# Patient Record
Sex: Female | Born: 1975 | Race: White | Hispanic: No | Marital: Married | Smoking: Never smoker
Health system: Southern US, Community
[De-identification: ages and names within clinical notes are randomized; demographics above are authoritative.]

## PROBLEM LIST (undated history)

## (undated) DIAGNOSIS — R51 Headache: Secondary | ICD-10-CM

## (undated) DIAGNOSIS — K649 Unspecified hemorrhoids: Secondary | ICD-10-CM

## (undated) HISTORY — PX: HERNIA REPAIR: SHX51

## (undated) HISTORY — PX: OTHER SURGICAL HISTORY: SHX169

## (undated) HISTORY — DX: Unspecified hemorrhoids: K64.9

---

## 2004-03-04 ENCOUNTER — Other Ambulatory Visit: Admission: RE | Admit: 2004-03-04 | Discharge: 2004-03-04 | Payer: Self-pay | Admitting: Family Medicine

## 2005-04-16 ENCOUNTER — Other Ambulatory Visit: Admission: RE | Admit: 2005-04-16 | Discharge: 2005-04-16 | Payer: Self-pay | Admitting: Family Medicine

## 2007-01-11 ENCOUNTER — Inpatient Hospital Stay (HOSPITAL_COMMUNITY): Admission: AD | Admit: 2007-01-11 | Discharge: 2007-01-13 | Payer: Self-pay | Admitting: Obstetrics and Gynecology

## 2009-02-07 ENCOUNTER — Other Ambulatory Visit: Admission: RE | Admit: 2009-02-07 | Discharge: 2009-02-07 | Payer: Self-pay | Admitting: Family Medicine

## 2011-02-18 LAB — GC/CHLAMYDIA PROBE AMP, GENITAL: Chlamydia: NEGATIVE

## 2011-02-18 LAB — ANTIBODY SCREEN: Antibody Screen: NEGATIVE

## 2011-02-18 LAB — HIV ANTIBODY (ROUTINE TESTING W REFLEX): HIV: NONREACTIVE

## 2011-02-18 LAB — ABO/RH: RH Type: POSITIVE

## 2011-05-28 LAB — CBC
HCT: 37.6
Hemoglobin: 10.2 — ABNORMAL LOW
Hemoglobin: 12.8
MCHC: 34.4
MCV: 96.8
RBC: 3.06 — ABNORMAL LOW
RBC: 3.87
WBC: 13.4 — ABNORMAL HIGH
WBC: 15.2 — ABNORMAL HIGH

## 2011-09-12 ENCOUNTER — Encounter (HOSPITAL_COMMUNITY): Payer: Self-pay | Admitting: *Deleted

## 2011-09-12 ENCOUNTER — Inpatient Hospital Stay (HOSPITAL_COMMUNITY)
Admission: AD | Admit: 2011-09-12 | Discharge: 2011-09-15 | DRG: 775 | Disposition: A | Discharge status: Home / Self Care | Payer: 59 | Source: Ambulatory Visit | Attending: Obstetrics and Gynecology | Admitting: Obstetrics and Gynecology

## 2011-09-12 DIAGNOSIS — O09529 Supervision of elderly multigravida, unspecified trimester: Secondary | ICD-10-CM | POA: Diagnosis present

## 2011-09-12 HISTORY — DX: Headache: R51

## 2011-09-12 NOTE — Progress Notes (Signed)
Pt states," My water broke at 9:40 pm . It was a lot of clear fluid which has continued to leak. I've had mild contractions off and on all day. They are now getting more regular and closer together.'

## 2011-09-13 ENCOUNTER — Encounter (HOSPITAL_COMMUNITY): Payer: Self-pay | Admitting: *Deleted

## 2011-09-13 LAB — CBC
Hemoglobin: 12.3 g/dL (ref 12.0–15.0)
MCH: 32.6 pg (ref 26.0–34.0)
MCHC: 34 g/dL (ref 30.0–36.0)
MCHC: 34.1 g/dL (ref 30.0–36.0)
MCV: 95.9 fL (ref 78.0–100.0)
Platelets: 108 10*3/uL — ABNORMAL LOW (ref 150–400)
RBC: 3.78 MIL/uL — ABNORMAL LOW (ref 3.87–5.11)
RDW: 13.1 % (ref 11.5–15.5)
WBC: 13.5 10*3/uL — ABNORMAL HIGH (ref 4.0–10.5)

## 2011-09-13 LAB — COMPREHENSIVE METABOLIC PANEL
AST: 23 U/L (ref 0–37)
Albumin: 3.3 g/dL — ABNORMAL LOW (ref 3.5–5.2)
CO2: 19 mEq/L (ref 19–32)
Calcium: 9 mg/dL (ref 8.4–10.5)
Creatinine, Ser: 0.51 mg/dL (ref 0.50–1.10)
GFR calc non Af Amer: >90 mL/min (ref >90)

## 2011-09-13 MED ORDER — ONDANSETRON HCL 4 MG/2ML IJ SOLN: 4.0000 mg | Freq: Four times a day (QID) | INTRAMUSCULAR | Status: DC | PRN

## 2011-09-13 MED ORDER — PRENATAL MULTIVITAMIN CH
1.0000 | ORAL_TABLET | Freq: Every day | ORAL | Status: DC
  Administered 2011-09-13 – 2011-09-15 (×3): 1 via ORAL
  Filled 2011-09-13 (×3): qty 1

## 2011-09-13 MED ORDER — LACTATED RINGERS IV SOLN: 500.0000 mL | INTRAVENOUS | Status: DC | PRN

## 2011-09-13 MED ORDER — SODIUM CHLORIDE 0.9 % IV SOLN
2.0000 g | Freq: Once | INTRAVENOUS | Status: AC
  Administered 2011-09-13: 2 g via INTRAVENOUS
  Filled 2011-09-13: qty 2000

## 2011-09-13 MED ORDER — PHENYLEPHRINE 40 MCG/ML (10ML) SYRINGE FOR IV PUSH (FOR BLOOD PRESSURE SUPPORT): 80.0000 ug | PREFILLED_SYRINGE | INTRAVENOUS | Status: DC | PRN

## 2011-09-13 MED ORDER — FLEET ENEMA 7-19 GM/118ML RE ENEM: 1.0000 | ENEMA | Freq: Every day | RECTAL | Status: DC | PRN

## 2011-09-13 MED ORDER — ZOLPIDEM TARTRATE 5 MG PO TABS: 5.0000 mg | ORAL_TABLET | Freq: Every evening | ORAL | Status: DC | PRN

## 2011-09-13 MED ORDER — LIDOCAINE HCL (PF) 1 % IJ SOLN
30.0000 mL | INTRAMUSCULAR | Status: DC | PRN
  Filled 2011-09-13: qty 30

## 2011-09-13 MED ORDER — LANOLIN HYDROUS EX OINT: TOPICAL_OINTMENT | CUTANEOUS | Status: DC | PRN

## 2011-09-13 MED ORDER — CITRIC ACID-SODIUM CITRATE 334-500 MG/5ML PO SOLN: 30.0000 mL | ORAL | Status: DC | PRN

## 2011-09-13 MED ORDER — SODIUM CHLORIDE 0.9 % IV SOLN
2.0000 g | Freq: Four times a day (QID) | INTRAVENOUS | Status: DC
  Administered 2011-09-13: 2 g via INTRAVENOUS
  Filled 2011-09-13 (×2): qty 2000

## 2011-09-13 MED ORDER — OXYTOCIN BOLUS FROM INFUSION
500.0000 mL | Freq: Once | INTRAVENOUS | Status: AC
  Administered 2011-09-13: 500 mL via INTRAVENOUS
  Filled 2011-09-13: qty 500
  Filled 2011-09-13: qty 1000

## 2011-09-13 MED ORDER — ONDANSETRON HCL 4 MG/2ML IJ SOLN: 4.0000 mg | INTRAMUSCULAR | Status: DC | PRN

## 2011-09-13 MED ORDER — DIPHENHYDRAMINE HCL 25 MG PO CAPS: 25.0000 mg | ORAL_CAPSULE | Freq: Four times a day (QID) | ORAL | Status: DC | PRN

## 2011-09-13 MED ORDER — DIBUCAINE 1 % RE OINT: 1.0000 "application " | TOPICAL_OINTMENT | RECTAL | Status: DC | PRN

## 2011-09-13 MED ORDER — EPHEDRINE 5 MG/ML INJ: 10.0000 mg | INTRAVENOUS | Status: DC | PRN

## 2011-09-13 MED ORDER — FLEET ENEMA 7-19 GM/118ML RE ENEM: 1.0000 | ENEMA | RECTAL | Status: DC | PRN

## 2011-09-13 MED ORDER — FENTANYL 2.5 MCG/ML BUPIVACAINE 1/10 % EPIDURAL INFUSION (WH - ANES): 14.0000 mL/h | INTRAMUSCULAR | Status: DC

## 2011-09-13 MED ORDER — ACETAMINOPHEN 325 MG PO TABS: 650.0000 mg | ORAL_TABLET | ORAL | Status: DC | PRN

## 2011-09-13 MED ORDER — DIPHENHYDRAMINE HCL 50 MG/ML IJ SOLN: 12.5000 mg | INTRAMUSCULAR | Status: DC | PRN

## 2011-09-13 MED ORDER — BENZOCAINE-MENTHOL 20-0.5 % EX AERO
INHALATION_SPRAY | CUTANEOUS | Status: AC
  Filled 2011-09-13: qty 56

## 2011-09-13 MED ORDER — SENNOSIDES-DOCUSATE SODIUM 8.6-50 MG PO TABS: 2.0000 | ORAL_TABLET | Freq: Every day | ORAL | Status: DC

## 2011-09-13 MED ORDER — BISACODYL 10 MG RE SUPP: 10.0000 mg | Freq: Every day | RECTAL | Status: DC | PRN

## 2011-09-13 MED ORDER — SIMETHICONE 80 MG PO CHEW: 80.0000 mg | CHEWABLE_TABLET | ORAL | Status: DC | PRN

## 2011-09-13 MED ORDER — IBUPROFEN 600 MG PO TABS: 600.0000 mg | ORAL_TABLET | Freq: Four times a day (QID) | ORAL | Status: DC | PRN

## 2011-09-13 MED ORDER — OXYTOCIN 20 UNITS IN LACTATED RINGERS INFUSION - SIMPLE
125.0000 mL/h | Freq: Once | INTRAVENOUS | Status: AC
  Administered 2011-09-13: 125 mL/h via INTRAVENOUS

## 2011-09-13 MED ORDER — OXYCODONE-ACETAMINOPHEN 5-325 MG PO TABS: 1.0000 | ORAL_TABLET | ORAL | Status: DC | PRN

## 2011-09-13 MED ORDER — LACTATED RINGERS IV SOLN
INTRAVENOUS | Status: DC
  Administered 2011-09-12: via INTRAVENOUS

## 2011-09-13 MED ORDER — BUTORPHANOL TARTRATE 2 MG/ML IJ SOLN
1.0000 mg | INTRAMUSCULAR | Status: DC | PRN
  Administered 2011-09-13: 1 mg via INTRAVENOUS
  Filled 2011-09-13: qty 1

## 2011-09-13 MED ORDER — TETANUS-DIPHTH-ACELL PERTUSSIS 5-2.5-18.5 LF-MCG/0.5 IM SUSP: 0.5000 mL | Freq: Once | INTRAMUSCULAR | Status: DC

## 2011-09-13 MED ORDER — BENZOCAINE-MENTHOL 20-0.5 % EX AERO: 1.0000 "application " | INHALATION_SPRAY | CUTANEOUS | Status: DC | PRN

## 2011-09-13 MED ORDER — IBUPROFEN 600 MG PO TABS
600.0000 mg | ORAL_TABLET | Freq: Four times a day (QID) | ORAL | Status: DC
  Administered 2011-09-13 – 2011-09-15 (×7): 600 mg via ORAL
  Filled 2011-09-13 (×10): qty 1

## 2011-09-13 MED ORDER — WITCH HAZEL-GLYCERIN EX PADS: 1.0000 "application " | MEDICATED_PAD | CUTANEOUS | Status: DC | PRN

## 2011-09-13 MED ORDER — ONDANSETRON HCL 4 MG PO TABS: 4.0000 mg | ORAL_TABLET | ORAL | Status: DC | PRN

## 2011-09-13 MED ORDER — EPHEDRINE 5 MG/ML INJ
10.0000 mg | INTRAVENOUS | Status: DC | PRN
Start: 2011-09-13 — End: 2011-09-13

## 2011-09-13 MED ORDER — LACTATED RINGERS IV SOLN: 500.0000 mL | Freq: Once | INTRAVENOUS | Status: DC

## 2011-09-13 NOTE — Progress Notes (Signed)
Delivery Note Excellent control, FHT reactive Two contractions-SVD VFI Apgars 9/9     Weight pending     Arterial ph  7.20 Placenta 3 vessels, intact EBL  350cc Second degree ML Lac repaired Pt/infant stable in LDR

## 2011-09-13 NOTE — H&P (Signed)
Bailey Norton is a 36 y.o. female presenting for SROM about 9:30pm, clear, now with some UCs.  No CNS change/epigastric pain. Maternal Medical History:  Reason for admission: Reason for admission: rupture of membranes.  Contractions: Onset was 3-5 hours ago.   Frequency: irregular.   Perceived severity is moderate.    Fetal activity: Perceived fetal activity is normal.      OB History    Grav Para Term Preterm Abortions TAB SAB Ect Mult Living   3 1 1  1  1   1      Past Medical History  Diagnosis Date  . Headache     hx migraines   Past Surgical History  Procedure Date  . Hernia repair     age 3   Family History: family history is not on file. Social History:  reports that she has never smoked. She does not have any smokeless tobacco history on file. She reports that she does not drink alcohol or use illicit drugs.  Review of Systems  Eyes: Negative for blurred vision.  Gastrointestinal: Negative for abdominal pain.  Neurological: Negative for headaches.    Dilation: 2.5 Effacement (%): 50 Station: -1 Exam by:: A.Davis,RN Blood pressure 141/66, pulse 105, temperature 98.3 F (36.8 C), temperature source Oral, resp. rate 20, height 5' 5.5" (1.664 m), weight 85.843 kg (189 lb 4 oz). Maternal Exam:  Uterine Assessment: Contraction strength is moderate.  Contraction frequency is irregular.   Abdomen: Patient reports no abdominal tenderness.   Fetal Exam Fetal Monitor Review: Pattern: accelerations present.       Physical Exam  Cardiovascular: Normal rate and regular rhythm.   Respiratory: Effort normal and breath sounds normal.  Neurological: She has normal reflexes.    Prenatal labs: ABO, Rh:   Antibody:   Rubella:   RPR:    HBsAg:    HIV:    GBS:     Assessment/Plan: 36 yo G3P1 at 56 6/7 weeks with SROM.  Antibiotics for GBBS prophylaxis.   Danaiya Steadman II,Javontay Vandam E 09/13/2011, 12:35 AM

## 2011-09-13 NOTE — Progress Notes (Signed)
FHT reactive, some variable decels with UC, good response to scalp stim  UCs q3-4 min  Cx 4/C/-1/vtx  D/W pt platelets--  C/W last pregnancy Repeat CBC/CMET/Uric Acid

## 2011-09-14 LAB — CBC
MCV: 97.1 fL (ref 78.0–100.0)
Platelets: 102 10*3/uL — ABNORMAL LOW (ref 150–400)
RBC: 3.15 MIL/uL — ABNORMAL LOW (ref 3.87–5.11)
RDW: 13.2 % (ref 11.5–15.5)
WBC: 14.8 10*3/uL — ABNORMAL HIGH (ref 4.0–10.5)

## 2011-09-14 LAB — RPR: RPR Ser Ql: NONREACTIVE

## 2011-09-14 NOTE — Progress Notes (Signed)
Post Partum Day 1 Subjective: no complaints, up ad lib, voiding, tolerating PO and + flatus  Objective: Blood pressure 92/55, pulse 73, temperature 97.9 F (36.6 C), temperature source Oral, resp. rate 18, height 5' 5.5" (1.664 m), weight 85.843 kg (189 lb 4 oz), unknown if currently breastfeeding.  Physical Exam:  General: alert and cooperative Lochia: appropriate Uterine Fundus: firm Perineum intact DVT Evaluation: No evidence of DVT seen on physical exam.   Basename 09/14/11 0524 09/13/11 0304  HGB 10.4* 12.6  HCT 30.6* 37.0    Assessment/Plan: Plan for discharge tomorrow   LOS: 2 days   Marlyss Cissell G 09/14/2011, 7:50 AM

## 2011-09-15 NOTE — Progress Notes (Signed)
Post Partum Day 2 Subjective: no complaints, up ad lib, voiding and tolerating PO  Objective: Blood pressure 111/80, pulse 105, temperature 97.9 F (36.6 C), temperature source Oral, resp. rate 18, height 5' 5.5" (1.664 m), weight 85.843 kg (189 lb 4 oz), unknown if currently breastfeeding.  Physical Exam:  General: alert and cooperative Lochia: appropriate Uterine Fundus: firm Perineum intact DVT Evaluation: No evidence of DVT seen on physical exam.   Basename 09/14/11 0524 09/13/11 0304  HGB 10.4* 12.6  HCT 30.6* 37.0    Assessment/Plan: Discharge home   LOS: 3 days   CURTIS,CAROL G 09/15/2011, 7:40 AM

## 2011-09-15 NOTE — Discharge Summary (Signed)
Obstetric Discharge Summary Reason for Admission: rupture of membranes Prenatal Procedures: ultrasound Intrapartum Procedures: spontaneous vaginal delivery Postpartum Procedures: none Complications-Operative and Postpartum: 2 degree perineal laceration Hemoglobin  Date Value Range Status  09/14/2011 10.4* 12.0-15.0 (g/dL) Final     DELTA CHECK NOTED     REPEATED TO VERIFY     HCT  Date Value Range Status  09/14/2011 30.6* 36.0-46.0 (%) Final    Discharge Diagnoses: Term Pregnancy-delivered  Discharge Information: Date: 09/15/2011 Activity: pelvic rest Diet: routine Medications: PNV and Ibuprofen Condition: stable Instructions: refer to practice specific booklet Discharge to: home   Newborn Data: Live born female  Birth Weight: 7 lb 3.3 oz (3269 g) APGAR: 9, 9  Home with mother.  CURTIS,CAROL G 09/15/2011, 7:44 AM

## 2014-06-11 ENCOUNTER — Encounter (HOSPITAL_COMMUNITY): Payer: Self-pay | Admitting: *Deleted

## 2016-04-22 DIAGNOSIS — H5213 Myopia, bilateral: Secondary | ICD-10-CM | POA: Diagnosis not present

## 2016-04-22 DIAGNOSIS — H43813 Vitreous degeneration, bilateral: Secondary | ICD-10-CM | POA: Diagnosis not present

## 2016-04-22 DIAGNOSIS — H35722 Serous detachment of retinal pigment epithelium, left eye: Secondary | ICD-10-CM | POA: Diagnosis not present

## 2016-04-22 DIAGNOSIS — H353122 Nonexudative age-related macular degeneration, left eye, intermediate dry stage: Secondary | ICD-10-CM | POA: Diagnosis not present

## 2016-11-30 DIAGNOSIS — Z6826 Body mass index (BMI) 26.0-26.9, adult: Secondary | ICD-10-CM | POA: Diagnosis not present

## 2016-11-30 DIAGNOSIS — Z1231 Encounter for screening mammogram for malignant neoplasm of breast: Secondary | ICD-10-CM | POA: Diagnosis not present

## 2016-11-30 DIAGNOSIS — Z01419 Encounter for gynecological examination (general) (routine) without abnormal findings: Secondary | ICD-10-CM | POA: Diagnosis not present

## 2016-11-30 MED FILL — SUMATRIPTAN SUCC 100 MG TAB: 100 | 30 days supply | Qty: 18 | Fill #0

## 2017-02-12 ENCOUNTER — Telehealth: Payer: Self-pay | Admitting: Family Medicine

## 2017-02-12 NOTE — Telephone Encounter (Signed)
Sent to me for labs to be entered by Selinda Orion by mistake.

## 2017-03-17 DIAGNOSIS — D2272 Melanocytic nevi of left lower limb, including hip: Secondary | ICD-10-CM | POA: Diagnosis not present

## 2017-06-02 DIAGNOSIS — H5213 Myopia, bilateral: Secondary | ICD-10-CM | POA: Diagnosis not present

## 2017-06-07 DIAGNOSIS — H353122 Nonexudative age-related macular degeneration, left eye, intermediate dry stage: Secondary | ICD-10-CM | POA: Diagnosis not present

## 2017-06-07 DIAGNOSIS — H43813 Vitreous degeneration, bilateral: Secondary | ICD-10-CM | POA: Diagnosis not present

## 2017-06-07 DIAGNOSIS — H5213 Myopia, bilateral: Secondary | ICD-10-CM | POA: Diagnosis not present

## 2017-07-14 DIAGNOSIS — H43393 Other vitreous opacities, bilateral: Secondary | ICD-10-CM | POA: Diagnosis not present

## 2017-07-14 DIAGNOSIS — H35713 Central serous chorioretinopathy, bilateral: Secondary | ICD-10-CM | POA: Diagnosis not present

## 2017-09-06 DIAGNOSIS — R5382 Chronic fatigue, unspecified: Secondary | ICD-10-CM | POA: Diagnosis not present

## 2017-09-06 DIAGNOSIS — Z5181 Encounter for therapeutic drug level monitoring: Secondary | ICD-10-CM | POA: Diagnosis not present

## 2017-09-06 DIAGNOSIS — Z0000 Encounter for general adult medical examination without abnormal findings: Secondary | ICD-10-CM | POA: Diagnosis not present

## 2017-09-06 DIAGNOSIS — Z13220 Encounter for screening for lipoid disorders: Secondary | ICD-10-CM | POA: Diagnosis not present

## 2017-09-06 DIAGNOSIS — G43009 Migraine without aura, not intractable, without status migrainosus: Secondary | ICD-10-CM | POA: Diagnosis not present

## 2017-09-22 MED FILL — TOPIRAMATE 25 MG TAB: 25 | 90 days supply | Qty: 180 | Fill #0

## 2018-06-06 DIAGNOSIS — H5213 Myopia, bilateral: Secondary | ICD-10-CM | POA: Diagnosis not present

## 2018-06-06 DIAGNOSIS — H52221 Regular astigmatism, right eye: Secondary | ICD-10-CM | POA: Diagnosis not present

## 2018-06-30 MED FILL — SUMAtriptan SUCCINATE 100 M: 100 | 90 days supply | Qty: 24 | Fill #0

## 2019-04-12 DIAGNOSIS — Z5181 Encounter for therapeutic drug level monitoring: Secondary | ICD-10-CM | POA: Diagnosis not present

## 2019-04-12 DIAGNOSIS — Z13220 Encounter for screening for lipoid disorders: Secondary | ICD-10-CM | POA: Diagnosis not present

## 2019-04-19 DIAGNOSIS — G43009 Migraine without aura, not intractable, without status migrainosus: Secondary | ICD-10-CM | POA: Diagnosis not present

## 2019-04-19 MED FILL — SUMAtriptan SUCCINATE 100 M: 100 | 30 days supply | Qty: 9 | Fill #0

## 2019-04-19 MED FILL — VALACYCLOVIR HCL 500 MG TAB: 500 | 30 days supply | Qty: 60 | Fill #0

## 2019-06-01 MED FILL — SUMAtriptan SUCCINATE 100 M: 100 | 30 days supply | Qty: 9 | Fill #0

## 2019-06-01 MED FILL — VALACYCLOVIR HCL 500 MG TAB: 500 | 30 days supply | Qty: 60 | Fill #0

## 2019-08-01 ENCOUNTER — Ambulatory Visit: Payer: 59 | Attending: Internal Medicine

## 2019-08-01 DIAGNOSIS — Z20828 Contact with and (suspected) exposure to other viral communicable diseases: Secondary | ICD-10-CM | POA: Diagnosis not present

## 2019-08-01 DIAGNOSIS — Z20822 Contact with and (suspected) exposure to covid-19: Secondary | ICD-10-CM

## 2019-08-03 LAB — NOVEL CORONAVIRUS, NAA: SARS-CoV-2, NAA: NOT DETECTED

## 2019-08-24 MED FILL — SUMAtriptan SUCCINATE 100 M: 100 | 30 days supply | Qty: 9 | Fill #1

## 2019-09-04 ENCOUNTER — Other Ambulatory Visit: Payer: Self-pay | Admitting: Family Medicine

## 2019-09-04 DIAGNOSIS — Z1231 Encounter for screening mammogram for malignant neoplasm of breast: Secondary | ICD-10-CM

## 2019-09-07 DIAGNOSIS — H5213 Myopia, bilateral: Secondary | ICD-10-CM | POA: Diagnosis not present

## 2019-09-07 DIAGNOSIS — H52221 Regular astigmatism, right eye: Secondary | ICD-10-CM | POA: Diagnosis not present

## 2019-11-29 ENCOUNTER — Other Ambulatory Visit: Payer: Self-pay

## 2019-11-29 ENCOUNTER — Ambulatory Visit
Admission: RE | Admit: 2019-11-29 | Discharge: 2019-11-29 | Disposition: A | Discharge status: Home / Self Care | Payer: 59 | Source: Ambulatory Visit | Attending: Family Medicine | Admitting: Family Medicine

## 2019-11-29 DIAGNOSIS — Z1231 Encounter for screening mammogram for malignant neoplasm of breast: Secondary | ICD-10-CM | POA: Diagnosis not present

## 2019-11-30 ENCOUNTER — Other Ambulatory Visit: Payer: Self-pay | Admitting: Family Medicine

## 2019-11-30 DIAGNOSIS — R928 Other abnormal and inconclusive findings on diagnostic imaging of breast: Secondary | ICD-10-CM

## 2019-12-13 ENCOUNTER — Other Ambulatory Visit: Payer: 59

## 2019-12-14 ENCOUNTER — Ambulatory Visit
Admission: RE | Admit: 2019-12-14 | Discharge: 2019-12-14 | Disposition: A | Discharge status: Home / Self Care | Payer: 59 | Source: Ambulatory Visit | Attending: Family Medicine | Admitting: Family Medicine

## 2019-12-14 ENCOUNTER — Other Ambulatory Visit: Payer: Self-pay

## 2019-12-14 ENCOUNTER — Ambulatory Visit: Payer: 59

## 2019-12-14 DIAGNOSIS — R928 Other abnormal and inconclusive findings on diagnostic imaging of breast: Secondary | ICD-10-CM

## 2019-12-14 DIAGNOSIS — R922 Inconclusive mammogram: Secondary | ICD-10-CM | POA: Diagnosis not present

## 2020-07-03 ENCOUNTER — Other Ambulatory Visit (HOSPITAL_COMMUNITY): Payer: Self-pay | Admitting: Family Medicine

## 2020-09-25 DIAGNOSIS — R030 Elevated blood-pressure reading, without diagnosis of hypertension: Secondary | ICD-10-CM | POA: Diagnosis not present

## 2020-09-25 DIAGNOSIS — G43009 Migraine without aura, not intractable, without status migrainosus: Secondary | ICD-10-CM | POA: Diagnosis not present

## 2020-09-25 DIAGNOSIS — Z5181 Encounter for therapeutic drug level monitoring: Secondary | ICD-10-CM | POA: Diagnosis not present

## 2020-09-25 DIAGNOSIS — R5382 Chronic fatigue, unspecified: Secondary | ICD-10-CM | POA: Diagnosis not present

## 2020-09-25 DIAGNOSIS — Z13220 Encounter for screening for lipoid disorders: Secondary | ICD-10-CM | POA: Diagnosis not present

## 2020-09-25 DIAGNOSIS — Z0000 Encounter for general adult medical examination without abnormal findings: Secondary | ICD-10-CM | POA: Diagnosis not present

## 2020-09-25 MED FILL — SUMAtriptan SUCCINATE 100 M: 100 | 30 days supply | Qty: 9 | Fill #0

## 2020-11-04 DIAGNOSIS — H5213 Myopia, bilateral: Secondary | ICD-10-CM | POA: Diagnosis not present

## 2020-12-18 ENCOUNTER — Other Ambulatory Visit: Payer: Self-pay | Admitting: Family Medicine

## 2020-12-18 ENCOUNTER — Encounter: Payer: Self-pay | Admitting: Gastroenterology

## 2020-12-18 DIAGNOSIS — Z1231 Encounter for screening mammogram for malignant neoplasm of breast: Secondary | ICD-10-CM

## 2021-01-22 ENCOUNTER — Other Ambulatory Visit (HOSPITAL_COMMUNITY): Payer: Self-pay

## 2021-01-22 MED FILL — Sumatriptan Succinate Tab 100 MG: ORAL | 30 days supply | Qty: 9 | Fill #0 | Status: AC

## 2021-02-12 ENCOUNTER — Ambulatory Visit: Payer: 59

## 2021-02-20 ENCOUNTER — Other Ambulatory Visit: Payer: Self-pay

## 2021-02-20 ENCOUNTER — Other Ambulatory Visit (HOSPITAL_BASED_OUTPATIENT_CLINIC_OR_DEPARTMENT_OTHER): Payer: Self-pay

## 2021-02-20 ENCOUNTER — Ambulatory Visit (AMBULATORY_SURGERY_CENTER): Payer: Self-pay | Admitting: *Deleted

## 2021-02-20 VITALS — Ht 65.5 in | Wt 160.8 lb

## 2021-02-20 DIAGNOSIS — Z1211 Encounter for screening for malignant neoplasm of colon: Secondary | ICD-10-CM

## 2021-02-20 MED ORDER — SUPREP BOWEL PREP KIT 17.5-3.13-1.6 GM/177ML PO SOLN
1.0000 | Freq: Once | ORAL | 0 refills | Status: DC
  Filled 2021-02-20: qty 354, 2d supply, fill #0
  Filled 2021-03-19: qty 354, 1d supply, fill #0

## 2021-02-20 NOTE — Progress Notes (Signed)
  No trouble with anesthesia, (received as a child), denies trouble moving neck, or hx/fam hx of malignant hyperthermia per pt   No egg or soy allergy  No home oxygen use   No medications for weight loss taken  emmi information given  Pt denies constipation issues  Pt informed that we do not do prior authorizations for prep

## 2021-03-03 ENCOUNTER — Encounter: Payer: 59 | Admitting: Gastroenterology

## 2021-03-06 ENCOUNTER — Other Ambulatory Visit (HOSPITAL_BASED_OUTPATIENT_CLINIC_OR_DEPARTMENT_OTHER): Payer: Self-pay

## 2021-03-18 ENCOUNTER — Encounter: Payer: Self-pay | Admitting: Gastroenterology

## 2021-03-19 ENCOUNTER — Other Ambulatory Visit (HOSPITAL_COMMUNITY): Payer: Self-pay

## 2021-03-20 ENCOUNTER — Ambulatory Visit (AMBULATORY_SURGERY_CENTER): Payer: 59 | Admitting: Gastroenterology

## 2021-03-20 ENCOUNTER — Other Ambulatory Visit: Payer: Self-pay

## 2021-03-20 ENCOUNTER — Encounter: Payer: Self-pay | Admitting: Gastroenterology

## 2021-03-20 VITALS — BP 98/71 | HR 65 | Temp 97.8°F | Resp 11 | Ht 65.0 in | Wt 160.0 lb

## 2021-03-20 DIAGNOSIS — K635 Polyp of colon: Secondary | ICD-10-CM | POA: Diagnosis not present

## 2021-03-20 DIAGNOSIS — Z1211 Encounter for screening for malignant neoplasm of colon: Secondary | ICD-10-CM | POA: Diagnosis not present

## 2021-03-20 DIAGNOSIS — D124 Benign neoplasm of descending colon: Secondary | ICD-10-CM

## 2021-03-20 MED ORDER — SODIUM CHLORIDE 0.9 % IV SOLN: 500.0000 mL | INTRAVENOUS | Status: DC

## 2021-03-20 NOTE — Progress Notes (Signed)
Called to room to assist during endoscopic procedure.  Patient ID and intended procedure confirmed with present staff. Received instructions for my participation in the procedure from the performing physician.  

## 2021-03-20 NOTE — Patient Instructions (Signed)
Thank you for letting us take care of your healthcare needs today. Please see handouts given to you on Polyps and Hemorrhoids.     YOU HAD AN ENDOSCOPIC PROCEDURE TODAY AT THE [facility]:   Refer to the procedure report that was given to you for any specific questions about what was found during the examination.  If the procedure report does not answer your questions, please call your gastroenterologist to clarify.  If you requested that your care partner not be given the details of your procedure findings, then the procedure report has been included in a sealed envelope for you to review at your convenience later.  YOU SHOULD EXPECT: Some feelings of bloating in the abdomen. Passage of more gas than usual.  Walking can help get rid of the air that was put into your GI tract during the procedure and reduce the bloating. If you had a lower endoscopy (such as a colonoscopy or flexible sigmoidoscopy) you may notice spotting of blood in your stool or on the toilet paper. If you underwent a bowel prep for your procedure, you may not have a normal bowel movement for a few days.  Please Note:  You might notice some irritation and congestion in your nose or some drainage.  This is from the oxygen used during your procedure.  There is no need for concern and it should clear up in a day or so.  SYMPTOMS TO REPORT IMMEDIATELY:  Following lower endoscopy (colonoscopy or flexible sigmoidoscopy):  Excessive amounts of blood in the stool  Significant tenderness or worsening of abdominal pains  Swelling of the abdomen that is new, acute  Fever of 100F or higher  For urgent or emergent issues, a gastroenterologist can be reached at any hour by calling [phone]. Do not use MyChart messaging for urgent concerns.    DIET:  We do recommend a small meal at first, but then you may proceed to your regular diet.  Drink plenty of fluids but you should avoid alcoholic beverages for 24  hours.  ACTIVITY:  You should plan to take it easy for the rest of today and you should NOT DRIVE or use heavy machinery until tomorrow (because of the sedation medicines used during the test).    FOLLOW UP: Our staff will call the number listed on your records 48-72 hours following your procedure to check on you and address any questions or concerns that you may have regarding the information given to you following your procedure. If we do not reach you, we will leave a message.  We will attempt to reach you two times.  During this call, we will ask if you have developed any symptoms of COVID 19. If you develop any symptoms (ie: fever, flu-like symptoms, shortness of breath, cough etc.) before then, please call [phone].  If you test positive for Covid 19 in the 2 weeks post procedure, please call and report this information to us.    If any biopsies were taken you will be contacted by phone or by letter within the next 1-3 weeks.  Please call us at [phone] if you have not heard about the biopsies in 3 weeks.    SIGNATURES/CONFIDENTIALITY: You and/or your care partner have signed paperwork which will be entered into your electronic medical record.  These signatures attest to the fact that that the information above on your After Visit Summary has been reviewed and is understood.  Full responsibility of the confidentiality of this discharge information   lies with you and/or your care-partner.  

## 2021-03-20 NOTE — Progress Notes (Signed)
pt tolerated well. VSS. awake and to recovery. Report given to RN.  

## 2021-03-20 NOTE — Progress Notes (Signed)
   Referring Provider: Maurice Small, MD Primary Care Physician:  Maurice Small, MD  Reason for Consultation:  Screening colonoscopy   IMPRESSION:  Need for colon cancer screening  PLAN: Colonoscopy today  Please see the "Patient Instructions" section for addition details about the plan.  HPI: Bailey Norton is a 45 y.o. female presents for screening colonoscopy. No baseline GI symptoms except for intermittent blood on the toilet paper attributed to hemorrhoids. Has occurred intermittently since pregnancy.  No known family history of colon cancer or polyps. No family history of uterine/endometrial cancer, pancreatic cancer or gastric/stomach cancer.   Past Medical History:  Diagnosis Date   Headache(784.0)    hx migraines   Hemorrhoids     Past Surgical History:  Procedure Laterality Date   HERNIA REPAIR     age 24   left humerus surgery      Current Outpatient Medications  Medication Sig Dispense Refill   Prenatal Vit-Fe Fumarate-FA (PRENATAL MULTIVITAMIN) TABS Take 1 tablet by mouth daily.     SUMAtriptan (IMITREX) 100 MG tablet TAKE 1 TABLET BY MOUTH ONCE A DAY IF NEEDED 9 tablet 3   valACYclovir (VALTREX) 1000 MG tablet Take 1,000 mg by mouth 2 (two) times daily. Takes PRN     Current Facility-Administered Medications  Medication Dose Route Frequency Provider Last Rate Last Admin   0.9 %  sodium chloride infusion  500 mL Intravenous Continuous Thornton Park, MD        Allergies as of 03/20/2021   (No Known Allergies)    Family History  Problem Relation Age of Onset   Colon cancer Neg Hx    Esophageal cancer Neg Hx    Rectal cancer Neg Hx    Stomach cancer Neg Hx       Physical Exam: General:   Alert,  well-nourished, pleasant and cooperative in NAD Head:  Normocephalic and atraumatic. Eyes:  Sclera clear, no icterus.   Conjunctiva pink. Ears:  Normal auditory acuity. Nose:  No deformity, discharge,  or lesions. Mouth:  No deformity or lesions.   Neck:   Supple; no masses or thyromegaly. Lungs:  Clear throughout to auscultation.   No wheezes. Heart:  Regular rate and rhythm; no murmurs. Abdomen:  Soft, nontender, nondistended, normal bowel sounds, no rebound or guarding. No hepatosplenomegaly.   Rectal:  Deferred  Msk:  Symmetrical. No boney deformities LAD: No inguinal or umbilical LAD Extremities:  No clubbing or edema. Neurologic:  Alert and  oriented x4;  grossly nonfocal Skin:  Intact without significant lesions or rashes. Psych:  Alert and cooperative. Normal mood and affect.      Kemyah Buser L. Tarri Glenn, MD, MPH 03/20/2021, 2:25 PM

## 2021-03-20 NOTE — Op Note (Signed)
[facility] Patient Name: Bailey Norton Procedure Date: 03/20/2021 2:19 PM MRN: [id number] Endoscopist: Thornton Park MD, MD Age: 45 Referring MD:  Date of Birth: [birth year] Gender: Female Account #: [id number] Procedure:                Colonoscopy Indications:              Screening for colorectal malignant neoplasm, This                            is the patient's first colonoscopy                           No known family history of colon cancer or polyps Medicines:                Monitored Anesthesia Care Procedure:                Pre-Anesthesia Assessment:                           - Prior to the procedure, a History and Physical                            was performed, and patient medications and                            allergies were reviewed. The patient's tolerance of                            previous anesthesia was also reviewed. The risks                            and benefits of the procedure and the sedation                            options and risks were discussed with the patient.                            All questions were answered, and informed consent                            was obtained. Prior Anticoagulants: The patient has                            taken no previous anticoagulant or antiplatelet                            agents. ASA Grade Assessment: II - A patient with                            mild systemic disease. After reviewing the risks                            and benefits, the patient was deemed in  satisfactory condition to undergo the procedure.                           After obtaining informed consent, the colonoscope                            was passed under direct vision. Throughout the                            procedure, the patient's blood pressure, pulse, and                            oxygen saturations were monitored continuously. The                            Olympus CF-HQ190L  [id number]) Colonoscope was                            introduced through the anus and advanced to the 4                            cm into the ileum. A second forward view of the                            right colon was performed. The colonoscopy was                            performed without difficulty. The patient tolerated                            the procedure well. The quality of the bowel                            preparation was good. The terminal ileum, ileocecal                            valve, appendiceal orifice, and rectum were                            photographed. Scope In: 2:35:43 PM Scope Out: 2:55:06 PM Scope Withdrawal Time: 0 hours 15 minutes 29 seconds  Total Procedure Duration: 0 hours 19 minutes 23 seconds  Findings:                 The perianal and digital rectal examinations were                            normal.                           Non-bleeding internal hemorrhoids were found.                           A 2 mm polyp was found in the proximal descending  colon. The polyp was sessile. The polyp was removed                            with a cold snare. Resection and retrieval were                            complete. Estimated blood loss was minimal.                           The exam was otherwise without abnormality on                            direct and retroflexion views. Complications:            No immediate complications. Estimated blood loss:                            Minimal. Estimated Blood Loss:     Estimated blood loss was minimal. Impression:               - Non-bleeding internal hemorrhoids.                           - One 2 mm polyp in the proximal descending colon,                            removed with a cold snare. Resected and retrieved.                           - The examination was otherwise normal on direct                            and retroflexion views. Recommendation:           - Patient has  a contact number available for                            emergencies. The signs and symptoms of potential                            delayed complications were discussed with the                            patient. Return to normal activities tomorrow.                            Written discharge instructions were provided to the                            patient.                           - Resume previous diet.                           - Continue present medications.                           -  Await pathology results.                           - Repeat colonoscopy date to be determined after                            pending pathology results are reviewed for                            surveillance.                           - Emerging evidence supports eating a diet of                            fruits, vegetables, grains, calcium, and yogurt                            while reducing red meat and alcohol may reduce the                            risk of colon cancer.                           - Thank you for allowing me to be involved in your                            colon cancer prevention. Thornton Park MD, MD 03/20/2021 3:01:41 PM This report has been signed electronically.

## 2021-03-24 ENCOUNTER — Telehealth: Payer: Self-pay

## 2021-03-24 ENCOUNTER — Telehealth: Payer: Self-pay | Admitting: *Deleted

## 2021-03-24 NOTE — Telephone Encounter (Signed)
Attempted f/u phone call. No answer. Mailbox full, unable to leave message.  

## 2021-03-24 NOTE — Telephone Encounter (Signed)
  Follow up Call-  Call back number 03/20/2021  Post procedure Call Back phone  # [phone]  Permission to leave phone message Yes  Some recent data might be hidden     Patient questions:  Do you have a fever, pain , or abdominal swelling? No. Pain Score  0 *  Have you tolerated food without any problems? Yes.    Have you been able to return to your normal activities? Yes.    Do you have any questions about your discharge instructions: Diet   No. Medications  No. Follow up visit  No.  Do you have questions or concerns about your Care? No.  Actions: * If pain score is 4 or above: No action needed, pain <4.

## 2021-03-27 ENCOUNTER — Encounter: Payer: Self-pay | Admitting: Gastroenterology

## 2021-04-07 ENCOUNTER — Ambulatory Visit
Admission: RE | Admit: 2021-04-07 | Discharge: 2021-04-07 | Disposition: A | Discharge status: Home / Self Care | Payer: 59 | Source: Ambulatory Visit | Attending: Family Medicine | Admitting: Family Medicine

## 2021-04-07 ENCOUNTER — Other Ambulatory Visit: Payer: Self-pay

## 2021-04-07 DIAGNOSIS — Z1231 Encounter for screening mammogram for malignant neoplasm of breast: Secondary | ICD-10-CM

## 2021-08-11 IMAGING — MG DIGITAL SCREENING BILAT W/ TOMO W/ CAD
8 series · 8 of 24 positions shown · non-contrast
Comparison: Previous exam(s).

CLINICAL DATA: Screening.

EXAM:
DIGITAL SCREENING BILATERAL MAMMOGRAM WITH TOMO AND CAD

[L MLO synth-2D]
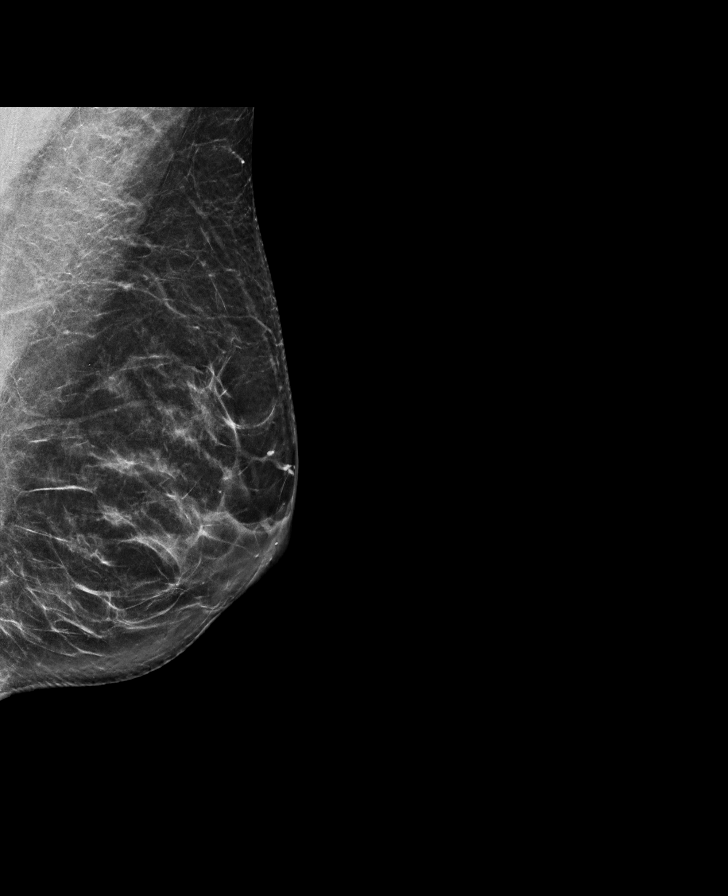

[L CC synth-2D]
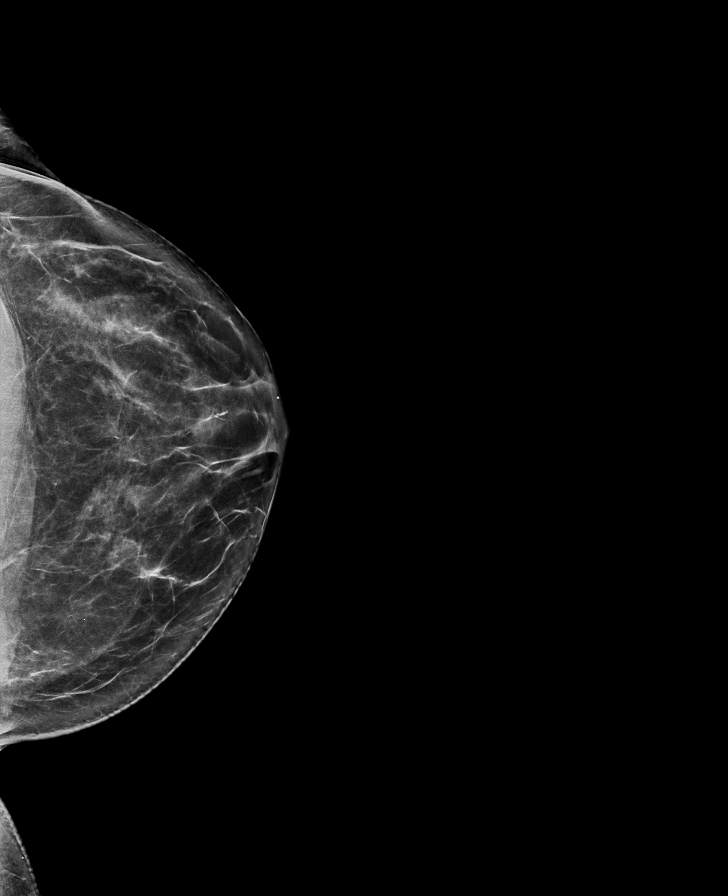

[R CC synth-2D]
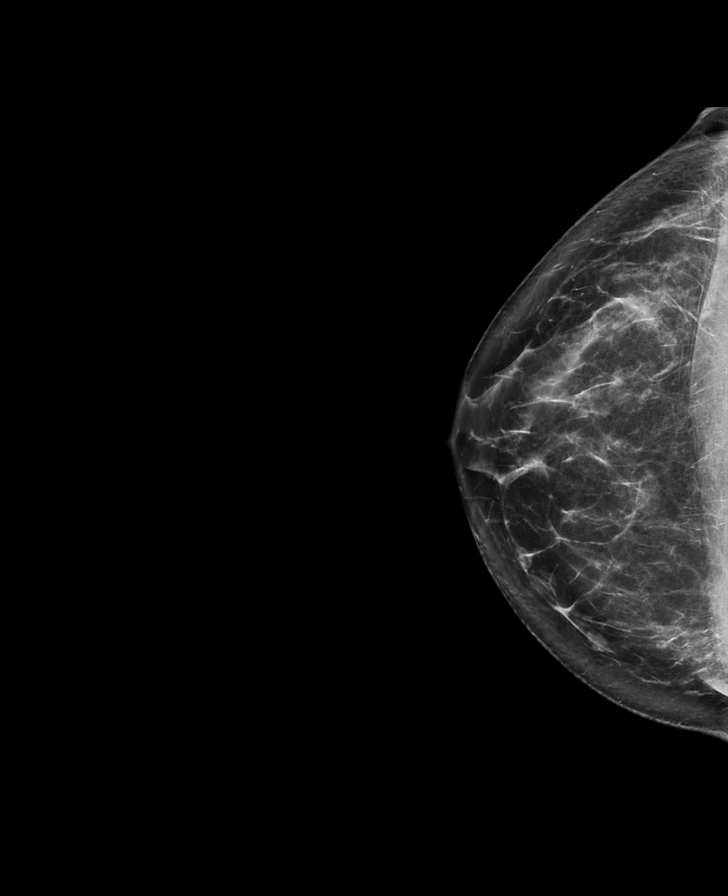

[R MLO synth-2D]
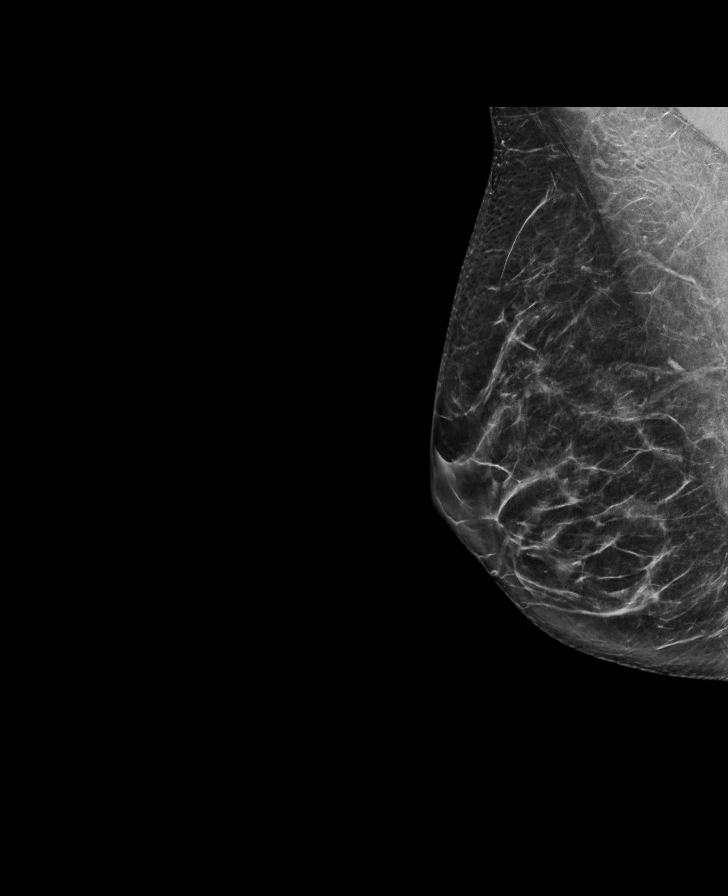

[L MLO tomo · tomo slice 36/71.0]
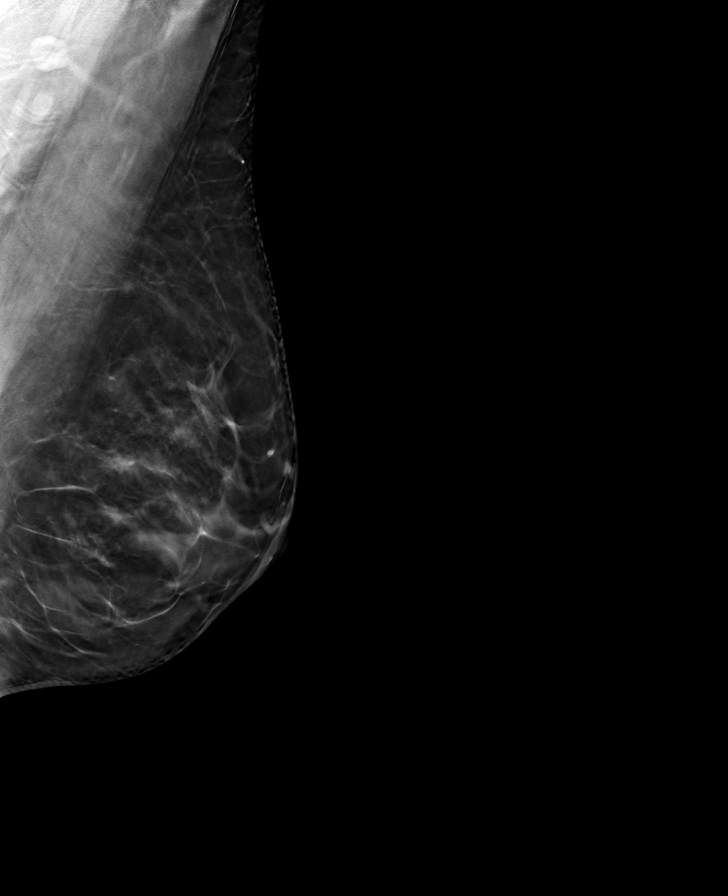

[R CC tomo · tomo slice 39/77.0]
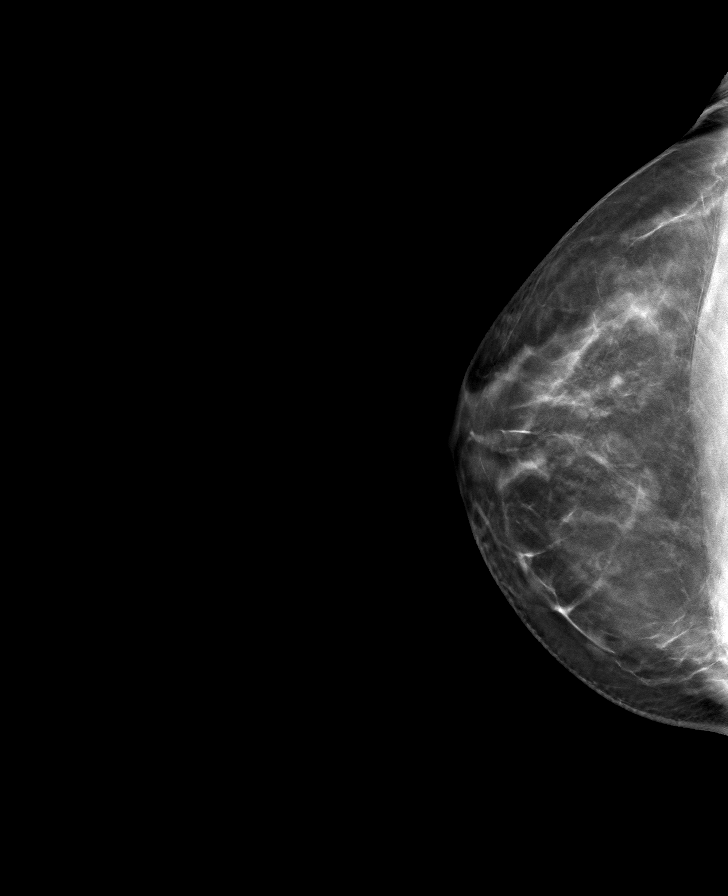

[L CC tomo · tomo slice 36/71.0]
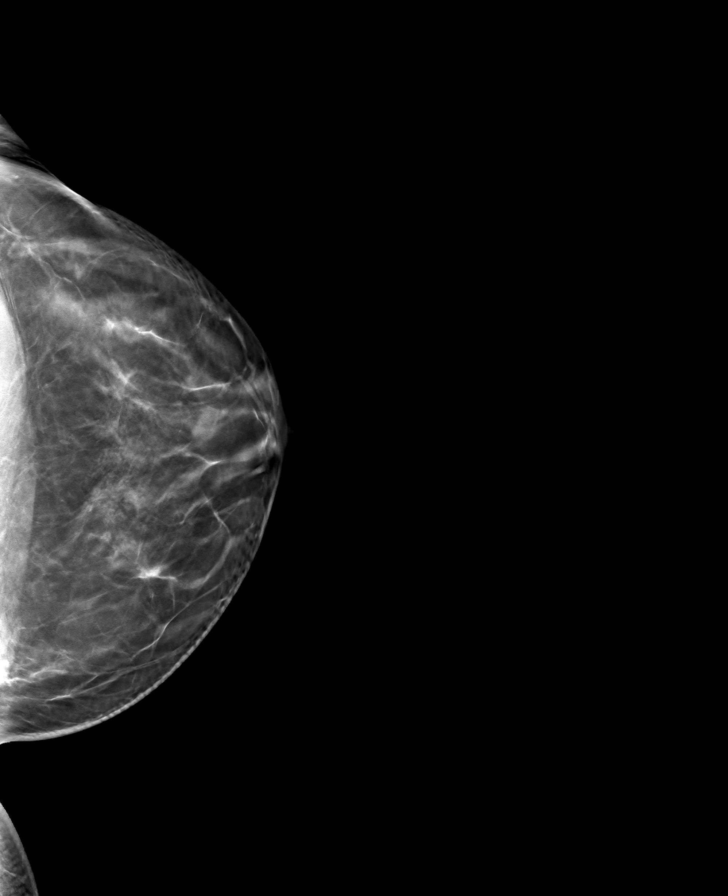

[R MLO tomo · tomo slice 35/69.0]
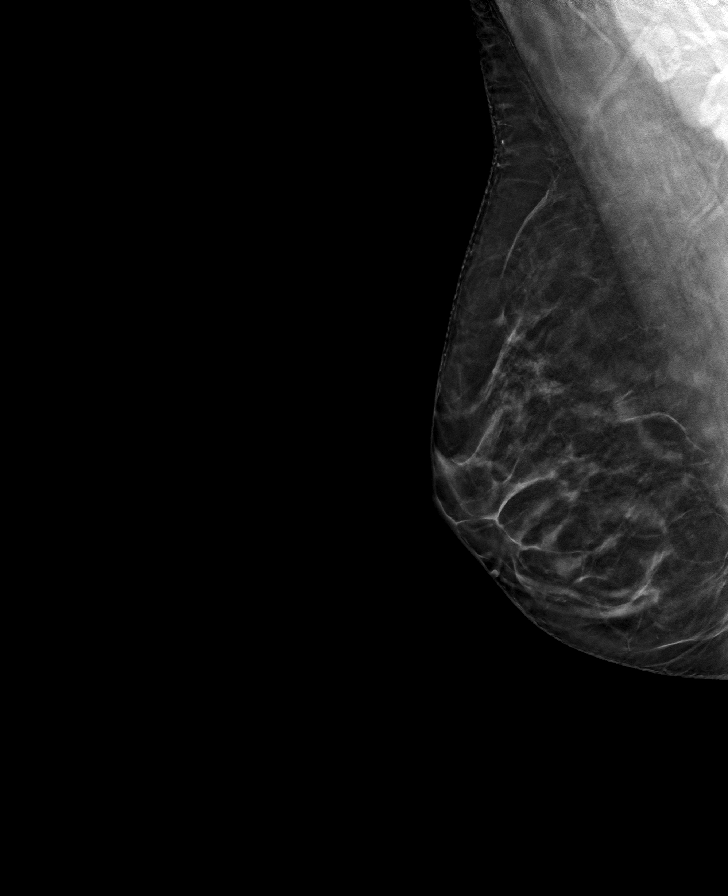

[8 of 24 positions shown; findings below may reference images not displayed]

ACR Breast Density Category b: There are scattered areas of
fibroglandular density.
FINDINGS: In the right breast, a possible mass warrants further evaluation. In
the left breast, no findings suspicious for malignancy. Images were
processed with CAD.
IMPRESSION: Further evaluation is suggested for possible mass in the right
breast.

RECOMMENDATION:
Diagnostic mammogram and possibly ultrasound of the right breast.
(Code:T1-A-550)

The patient will be contacted regarding the findings, and additional
imaging will be scheduled.

BI-RADS CATEGORY  0: Incomplete. Need additional imaging evaluation
and/or prior mammograms for comparison.

## 2021-09-03 ENCOUNTER — Other Ambulatory Visit (HOSPITAL_COMMUNITY): Payer: Self-pay

## 2021-09-04 ENCOUNTER — Ambulatory Visit (INDEPENDENT_AMBULATORY_CARE_PROVIDER_SITE_OTHER): Payer: 59 | Admitting: Family

## 2021-09-04 ENCOUNTER — Other Ambulatory Visit: Payer: Self-pay

## 2021-09-04 ENCOUNTER — Other Ambulatory Visit (HOSPITAL_COMMUNITY): Payer: Self-pay

## 2021-09-04 ENCOUNTER — Encounter: Payer: Self-pay | Admitting: Family

## 2021-09-04 VITALS — BP 133/86 | HR 83 | Temp 97.7°F | Ht 65.0 in | Wt 160.6 lb

## 2021-09-04 DIAGNOSIS — G43829 Menstrual migraine, not intractable, without status migrainosus: Secondary | ICD-10-CM | POA: Diagnosis not present

## 2021-09-04 DIAGNOSIS — B009 Herpesviral infection, unspecified: Secondary | ICD-10-CM

## 2021-09-04 MED ORDER — SUMATRIPTAN SUCCINATE 100 MG PO TABS
100.0000 mg | ORAL_TABLET | Freq: Every day | ORAL | 3 refills | Status: DC | PRN
  Filled 2021-09-04: qty 9, 30d supply, fill #0
  Filled 2022-06-22: qty 9, 30d supply, fill #1

## 2021-09-04 NOTE — Progress Notes (Signed)
New Patient Office Visit  Subjective:  Patient ID: Bailey Norton, female    DOB: 03-Feb-1976  Age: 46 y.o. MRN: [id number]  CC:  Chief Complaint  Patient presents with   Establish Care    HPI Bailey Norton presents for establishing care and to discuss one chronic problem.  Menstrual migraines: pt reports this as a chronic problem, symptoms start at onset of menstrual cycle, sometimes mid-cycle. Sx currently controlled with Imitrex and Ibuprofen. pt not currently on any hormonal birth control. Herpes Simplex: pt reports having hx of episodic fever blister outbreaks on lips. Sx controlled with Valtrex prn.   Past Medical History:  Diagnosis Date   Headache(784.0)    hx migraines   Hemorrhoids     Past Surgical History:  Procedure Laterality Date   HERNIA REPAIR     age 53   left humerus surgery      Family History  Problem Relation Age of Onset   Colon cancer Neg Hx    Esophageal cancer Neg Hx    Rectal cancer Neg Hx    Stomach cancer Neg Hx     Social History   Socioeconomic History   Marital status: Married    Spouse name: Not on file   Number of children: Not on file   Years of education: Not on file   Highest education level: Not on file  Occupational History   Not on file  Tobacco Use   Smoking status: Never   Smokeless tobacco: Never  Vaping Use   Vaping Use: Never used  Substance and Sexual Activity   Alcohol use: Yes    Comment: wine several times weekly   Drug use: No   Sexual activity: Yes  Other Topics Concern   Not on file  Social History Narrative   Not on file   Social Determinants of Health   Financial Resource Strain: Not on file  Food Insecurity: Not on file  Transportation Needs: Not on file  Physical Activity: Not on file  Stress: Not on file  Social Connections: Not on file  Intimate Partner Violence: Not on file    Objective:   Today's Vitals: BP 133/86    Pulse 83    Temp 97.7 F (36.5 C) (Temporal)    Ht 5\' 5"  (1.651  m)    Wt 160 lb 9.6 oz (72.8 kg)    LMP 09/01/2021    SpO2 100%    BMI 26.73 kg/m   Physical Exam Vitals and nursing note reviewed.  Constitutional:      Appearance: Normal appearance.  Cardiovascular:     Rate and Rhythm: Normal rate and regular rhythm.  Pulmonary:     Effort: Pulmonary effort is normal.     Breath sounds: Normal breath sounds.  Musculoskeletal:        General: Normal range of motion.  Skin:    General: Skin is warm and dry.  Neurological:     Mental Status: She is alert.  Psychiatric:        Mood and Affect: Mood normal.        Behavior: Behavior normal.    Assessment & Plan:   Problem List Items Addressed This Visit       Cardiovascular and Mediastinum   Menstrual migraine without status migrainosus, not intractable - Primary    Chronic - stable with Imitrex & Ibuprofen      Relevant Medications   ibuprofen (MOTRIN IB) 200 MG tablet   SUMAtriptan (IMITREX)  100 MG tablet     Other   Herpes simplex    Chronic - controlled with Valtrex prn       Outpatient Encounter Medications as of 09/04/2021  Medication Sig   valACYclovir (VALTREX) 1000 MG tablet Take 1,000 mg by mouth 2 (two) times daily. Takes PRN   [DISCONTINUED] SUMAtriptan (IMITREX) 100 MG tablet TAKE 1 TABLET BY MOUTH ONCE A DAY IF NEEDED   ibuprofen (MOTRIN IB) 200 MG tablet 1 tablet as needed   Multiple Vitamin (MULTIVITAMINS PO) 1 tablet   SUMAtriptan (IMITREX) 100 MG tablet Take 1 tablet (100 mg total) by mouth daily as needed.   [DISCONTINUED] Prenatal Vit-Fe Fumarate-FA (PRENATAL MULTIVITAMIN) TABS Take 1 tablet by mouth daily. (Patient not taking: Reported on 09/04/2021)   No facility-administered encounter medications on file as of 09/04/2021.    Follow-up: Return for Complete physical w/fasting labs.   Jeanie Sewer, NP

## 2021-09-04 NOTE — Assessment & Plan Note (Signed)
Chronic - controlled with Valtrex prn

## 2021-09-04 NOTE — Assessment & Plan Note (Signed)
Chronic - stable with Imitrex & Ibuprofen

## 2021-09-04 NOTE — Patient Instructions (Addendum)
Welcome to [facility]! It was a pleasure meeting you today.  Schedule a physical with fasting labs at your convenience. I have sent a refill for your Imitrex to your pharmacy.     PLEASE NOTE:  If you had any LAB tests please let us know if you have not heard back within a few days. You may see your results on MyChart before we have a chance to review them but we will give you a call once they are reviewed by [place]. If we ordered any REFERRALS today, please let us know if you have not heard from their office within the next week.  Let us know through MyChart if you are needing REFILLS, or have your pharmacy send [place] the request. You can also use MyChart to communicate with me or any office staff.  Please try these tips to maintain a healthy lifestyle:  Eat most of your calories during the day when you are active. Eliminate processed foods including packaged sweets (pies, cakes, cookies), reduce intake of potatoes, white bread, white pasta, and white rice. Look for whole grain options, oat flour or almond flour.  Each meal should contain half fruits/vegetables, one quarter protein, and one quarter carbs (no bigger than a computer mouse).  Cut down on sweet beverages. This includes juice, soda, and sweet tea. Also watch fruit intake, though this is a healthier sweet option, it still contains natural sugar! Limit to 3 servings daily.  Drink at least 1 glass of water with each meal and aim for at least 8 glasses per day  Exercise at least 150 minutes every week.

## 2021-09-29 ENCOUNTER — Encounter: Payer: 59 | Admitting: Family

## 2021-10-06 ENCOUNTER — Ambulatory Visit (INDEPENDENT_AMBULATORY_CARE_PROVIDER_SITE_OTHER): Payer: 59 | Admitting: Family

## 2021-10-06 ENCOUNTER — Other Ambulatory Visit: Payer: Self-pay

## 2021-10-06 ENCOUNTER — Encounter: Payer: Self-pay | Admitting: Family

## 2021-10-06 ENCOUNTER — Other Ambulatory Visit (HOSPITAL_COMMUNITY)
Admission: RE | Admit: 2021-10-06 | Discharge: 2021-10-06 | Disposition: A | Discharge status: Home / Self Care | Payer: 59 | Source: Ambulatory Visit | Attending: Family | Admitting: Family

## 2021-10-06 VITALS — BP 140/76 | HR 90 | Temp 97.9°F | Ht 65.0 in | Wt 163.6 lb

## 2021-10-06 DIAGNOSIS — Z124 Encounter for screening for malignant neoplasm of cervix: Secondary | ICD-10-CM | POA: Diagnosis not present

## 2021-10-06 NOTE — Patient Instructions (Addendum)
It was very nice to see you today!  PAP smear with HPV testing has been sent out for analysis, allow up to 3 days for results. If you would like to get fasting labs done in the future just let me know and I will order.  Continue to monitor your blood pressure, preferably at times of relaxation, if possible!  Drink at least 64oz water daily, eat low sodium diet (including beverages), and shoot for 86minutes of exercise as many days as possible.     Please try these tips to maintain a healthy lifestyle:  Eat most of your calories during the day when you are active. Eliminate processed foods including packaged sweets (pies, cakes, cookies), reduce intake of potatoes, white bread, white pasta, and white rice. Look for whole grain options, oat flour or almond flour.  Each meal should contain half fruits/vegetables, one quarter protein, and one quarter carbs (no bigger than a computer mouse).  Cut down on sweet beverages. This includes juice, soda, and sweet tea. Also watch fruit intake, though this is a healthier sweet option, it still contains natural sugar! Limit to 3 servings daily.  Drink at least 1 glass of water with each meal and aim for at least 8 glasses per day  Exercise at least 150 minutes every week.

## 2021-10-06 NOTE — Progress Notes (Signed)
° °  Subjective:     Patient ID: Bailey Norton, female    DOB: Dec 24, 1975, 46 y.o.   MRN: [id number]  Chief Complaint  Patient presents with   Gynecologic Exam   HPI: PAP smear with gynecological exam:  pt here for her PAP smear testing. She is not fasting and would like to defer a full physical with labs today. Pt denies any vaginal concerns today. Reports regular menses, sometimes heavier than usual, using condoms for birth control.   Health Maintenance Due  Topic Date Due   Hepatitis C Screening  Never done   PAP SMEAR-Modifier  Never done   TETANUS/TDAP  02/08/2019   COVID-19 Vaccine (3 - Booster for [employer] series) 07/13/2020    Past Medical History:  Diagnosis Date   Headache(784.0)    hx migraines   Hemorrhoids     Past Surgical History:  Procedure Laterality Date   HERNIA REPAIR     age 42   left humerus surgery      Outpatient Medications Prior to Visit  Medication Sig Dispense Refill   ibuprofen (ADVIL) 200 MG tablet 1 tablet as needed     Multiple Vitamin (MULTIVITAMINS PO) 1 tablet     SUMAtriptan (IMITREX) 100 MG tablet Take 1 tablet (100 mg total) by mouth daily as needed. 9 tablet 3   valACYclovir (VALTREX) 1000 MG tablet Take 1,000 mg by mouth 2 (two) times daily. Takes PRN     No facility-administered medications prior to visit.    No Known Allergies      Objective:    Physical Exam Vitals and nursing note reviewed.  Constitutional:      Appearance: Normal appearance.  Cardiovascular:     Rate and Rhythm: Normal rate and regular rhythm.  Pulmonary:     Effort: Pulmonary effort is normal.     Breath sounds: Normal breath sounds.  Genitourinary:    Exam position: Lithotomy position.     Pubic Area: No rash.      Labia:        Right: No rash, tenderness or lesion.        Left: No rash, tenderness or lesion.      Vagina: Normal.     Cervix: Normal.  Musculoskeletal:        General: Normal range of motion.  Skin:    General: Skin is warm  and dry.  Neurological:     Mental Status: She is alert.  Psychiatric:        Mood and Affect: Mood normal.        Behavior: Behavior normal.    BP 140/76    Pulse 90    Temp 97.9 F (36.6 C) (Temporal)    Ht 5\' 5"  (1.651 m)    Wt 163 lb 9.6 oz (74.2 kg)    SpO2 99%    BMI 27.22 kg/m  Wt Readings from Last 3 Encounters:  10/06/21 163 lb 9.6 oz (74.2 kg)  09/04/21 160 lb 9.6 oz (72.8 kg)  03/20/21 160 lb (72.6 kg)       Assessment & Plan:   Problem List Items Addressed This Visit   None Visit Diagnoses     Encounter for Pap smear of cervix with HPV DNA cotesting    -  Primary   Relevant Orders   Cytology - PAP

## 2021-10-07 LAB — CYTOLOGY - PAP
Comment: NEGATIVE
Diagnosis: NEGATIVE
High risk HPV: NEGATIVE

## 2021-11-05 DIAGNOSIS — Z135 Encounter for screening for eye and ear disorders: Secondary | ICD-10-CM | POA: Diagnosis not present

## 2021-11-05 DIAGNOSIS — H524 Presbyopia: Secondary | ICD-10-CM | POA: Diagnosis not present

## 2021-11-05 DIAGNOSIS — H5213 Myopia, bilateral: Secondary | ICD-10-CM | POA: Diagnosis not present

## 2022-05-04 ENCOUNTER — Encounter: Payer: Self-pay | Admitting: *Deleted

## 2022-06-22 ENCOUNTER — Other Ambulatory Visit: Payer: Self-pay | Admitting: Family

## 2022-06-22 ENCOUNTER — Other Ambulatory Visit (HOSPITAL_COMMUNITY): Payer: Self-pay

## 2022-06-22 DIAGNOSIS — Z1231 Encounter for screening mammogram for malignant neoplasm of breast: Secondary | ICD-10-CM

## 2022-07-23 ENCOUNTER — Encounter: Payer: Self-pay | Admitting: *Deleted

## 2022-08-31 ENCOUNTER — Ambulatory Visit
Admission: RE | Admit: 2022-08-31 | Discharge: 2022-08-31 | Disposition: A | Discharge status: Home / Self Care | Payer: Commercial Managed Care - PPO | Source: Ambulatory Visit | Attending: Family | Admitting: Family

## 2022-08-31 DIAGNOSIS — Z1231 Encounter for screening mammogram for malignant neoplasm of breast: Secondary | ICD-10-CM

## 2022-09-02 NOTE — Progress Notes (Signed)
Mammogram normal! Recheck in 1-2 years.

## 2022-10-09 ENCOUNTER — Other Ambulatory Visit: Payer: Self-pay | Admitting: Primary Care

## 2022-10-09 DIAGNOSIS — M7551 Bursitis of right shoulder: Secondary | ICD-10-CM

## 2022-10-09 MED ORDER — PREDNISONE 20 MG PO TABS
ORAL_TABLET | ORAL | 0 refills | Status: DC
Start: 2022-10-09 — End: 2023-03-29

## 2022-10-11 NOTE — Progress Notes (Deleted)
    Tayia Stonesifer T. Winifred Balogh, MD, [address] at [facility] [place], [address]  Phone: [phone]  FAX: [phone]  Bailey Norton - 47 y.o. female  MRN [id number]  Date of Birth: 1976-01-16  Date: 10/12/2022  PCP: Jeanie Sewer, NP  Referral: Jeanie Sewer, NP  No chief complaint on file.  Subjective:   Bailey Norton is a 47 y.o. very pleasant female patient with There is no height or weight on file to calculate BMI. who presents with the following:  Pleasant patient presents with R sided shoulder pain:  She was given a round of oral steroids on 10/09/2022.     Review of Systems is noted in the HPI, as appropriate  Objective:   There were no vitals taken for this visit.  GEN: No acute distress; alert,appropriate. PULM: Breathing comfortably in no respiratory distress PSYCH: Normally interactive.   Laboratory and Imaging Data:  Assessment and Plan:   ***

## 2022-10-12 ENCOUNTER — Ambulatory Visit: Payer: Commercial Managed Care - PPO | Admitting: Family Medicine

## 2023-03-10 ENCOUNTER — Encounter (INDEPENDENT_AMBULATORY_CARE_PROVIDER_SITE_OTHER): Payer: Self-pay

## 2023-03-29 ENCOUNTER — Ambulatory Visit (INDEPENDENT_AMBULATORY_CARE_PROVIDER_SITE_OTHER): Payer: Commercial Managed Care - PPO | Admitting: Family

## 2023-03-29 ENCOUNTER — Encounter: Payer: Self-pay | Admitting: Family

## 2023-03-29 ENCOUNTER — Other Ambulatory Visit (HOSPITAL_COMMUNITY): Payer: Self-pay

## 2023-03-29 VITALS — BP 150/89 | HR 68 | Temp 97.5°F | Ht 65.0 in | Wt 156.6 lb

## 2023-03-29 DIAGNOSIS — Z13220 Encounter for screening for lipoid disorders: Secondary | ICD-10-CM

## 2023-03-29 DIAGNOSIS — R030 Elevated blood-pressure reading, without diagnosis of hypertension: Secondary | ICD-10-CM | POA: Diagnosis not present

## 2023-03-29 DIAGNOSIS — G43829 Menstrual migraine, not intractable, without status migrainosus: Secondary | ICD-10-CM

## 2023-03-29 DIAGNOSIS — Z1159 Encounter for screening for other viral diseases: Secondary | ICD-10-CM

## 2023-03-29 DIAGNOSIS — Z0000 Encounter for general adult medical examination without abnormal findings: Secondary | ICD-10-CM

## 2023-03-29 LAB — LIPID PANEL
Cholesterol: 163 mg/dL (ref 0–200)
HDL: 57.2 mg/dL (ref >39.00)
LDL Cholesterol: 97 mg/dL (ref 0–99)
NonHDL: 105.76
Total CHOL/HDL Ratio: 3
Triglycerides: 44 mg/dL (ref 0.0–149.0)
VLDL: 8.8 mg/dL (ref 0.0–40.0)

## 2023-03-29 LAB — COMPREHENSIVE METABOLIC PANEL
ALT: 14 U/L (ref 0–35)
AST: 19 U/L (ref 0–37)
Albumin: 4.5 g/dL (ref 3.5–5.2)
Alkaline Phosphatase: 44 U/L (ref 39–117)
BUN: 13 mg/dL (ref 6–23)
CO2: 23 mEq/L (ref 19–32)
Calcium: 9.2 mg/dL (ref 8.4–10.5)
Chloride: 108 mEq/L (ref 96–112)
Creatinine, Ser: 0.71 mg/dL (ref 0.40–1.20)
GFR: 100.61 mL/min (ref >60.00)
Glucose, Bld: 90 mg/dL (ref 70–99)
Potassium: 4 mEq/L (ref 3.5–5.1)
Sodium: 139 mEq/L (ref 135–145)
Total Bilirubin: 0.5 mg/dL (ref 0.2–1.2)
Total Protein: 7.1 g/dL (ref 6.0–8.3)

## 2023-03-29 LAB — CBC WITH DIFFERENTIAL/PLATELET
Basophils Absolute: 0 10*3/uL (ref 0.0–0.1)
Basophils Relative: 0.4 % (ref 0.0–3.0)
Eosinophils Absolute: 0 10*3/uL (ref 0.0–0.7)
Eosinophils Relative: 0.7 % (ref 0.0–5.0)
HCT: 35.1 % — ABNORMAL LOW (ref 36.0–46.0)
Hemoglobin: 11.5 g/dL — ABNORMAL LOW (ref 12.0–15.0)
Lymphocytes Relative: 26.5 % (ref 12.0–46.0)
Lymphs Abs: 1.1 10*3/uL (ref 0.7–4.0)
MCHC: 32.8 g/dL (ref 30.0–36.0)
MCV: 89.1 fl (ref 78.0–100.0)
Monocytes Absolute: 0.3 10*3/uL (ref 0.1–1.0)
Monocytes Relative: 8 % (ref 3.0–12.0)
Neutro Abs: 2.7 10*3/uL (ref 1.4–7.7)
Neutrophils Relative %: 64.4 % (ref 43.0–77.0)
Platelets: 185 10*3/uL (ref 150.0–400.0)
RBC: 3.94 Mil/uL (ref 3.87–5.11)
RDW: 14.4 % (ref 11.5–15.5)
WBC: 4.2 10*3/uL (ref 4.0–10.5)

## 2023-03-29 LAB — TSH: TSH: 1.38 u[IU]/mL (ref 0.35–5.50)

## 2023-03-29 MED ORDER — SUMATRIPTAN SUCCINATE 100 MG PO TABS
100.0000 mg | ORAL_TABLET | Freq: Every day | ORAL | 3 refills | Status: DC | PRN
Start: 2023-03-29 — End: 2024-05-10
  Filled 2023-03-29: qty 9, 30d supply, fill #0
  Filled 2023-08-11: qty 9, 30d supply, fill #1
  Filled 2024-01-21: qty 9, 30d supply, fill #2

## 2023-03-29 NOTE — Assessment & Plan Note (Signed)
Chronic - stable taking Imitrex 100mg  prn & Ibuprofen, tolerating, denies SE sending refill f/u 1 yr

## 2023-03-29 NOTE — Patient Instructions (Signed)
It was very nice to see you today!     I will review your lab results via MyChart in a few days.  Have a great rest of the summer!!      PLEASE NOTE:  If you had any lab tests please let us know if you have not heard back within a few days. You may see your results on MyChart before we have a chance to review them but we will give you a call once they are reviewed by [place]. If we ordered any referrals today, please let us know if you have not heard from their office within the next week.

## 2023-03-29 NOTE — Progress Notes (Signed)
Phone [phone]  Subjective:   Patient is a 47 y.o. female presenting for annual physical.    Chief Complaint  Patient presents with   Annual Exam    Fasting w/ labs    Menstrual migraines:  pt reports this as a chronic problem, symptoms start at onset of menstrual cycle, sometimes mid-cycle. Sx currently controlled with Imitrex and Ibuprofen. pt not currently on any hormonal birth control.  See problem oriented charting- ROS- full  review of systems was completed and negative except for HPI above.   The following were reviewed and entered/updated in epic: Past Medical History:  Diagnosis Date   Headache(784.0)    hx migraines   Hemorrhoids    Patient Active Problem List   Diagnosis Date Noted   Menstrual migraine without status migrainosus, not intractable 09/04/2021   Herpes simplex 09/04/2021   Past Surgical History:  Procedure Laterality Date   HERNIA REPAIR     age 9   left humerus surgery      Family History  Problem Relation Age of Onset   Colon cancer Neg Hx    Esophageal cancer Neg Hx    Rectal cancer Neg Hx    Stomach cancer Neg Hx     Medications- reviewed and updated Current Outpatient Medications  Medication Sig Dispense Refill   ibuprofen (ADVIL) 200 MG tablet 1 tablet as needed     Multiple Vitamin (MULTIVITAMINS PO) 1 tablet     valACYclovir (VALTREX) 1000 MG tablet Take 1,000 mg by mouth 2 (two) times daily. Takes PRN     SUMAtriptan (IMITREX) 100 MG tablet Take 1 tablet (100 mg total) by mouth daily as needed. 9 tablet 3   No current facility-administered medications for this visit.    Allergies-reviewed and updated No Known Allergies  Social History   Social History Narrative   Not on file    Objective:  BP (!) 150/89   Pulse 68   Temp (!) 97.5 F (36.4 C) (Temporal)   Ht 5\' 5"  (1.651 m)   Wt 156 lb 9.6 oz (71 kg)   LMP 03/25/2023 (Exact Date)   SpO2 99%   BMI 26.06 kg/m  Physical Exam Vitals and nursing note  reviewed.  Constitutional:      Appearance: Normal appearance.  HENT:     Head: Normocephalic.     Right Ear: Tympanic membrane normal.     Left Ear: Tympanic membrane normal.     Nose: Nose normal.     Mouth/Throat:     Mouth: Mucous membranes are moist.  Eyes:     Pupils: Pupils are equal, round, and reactive to light.  Cardiovascular:     Rate and Rhythm: Normal rate and regular rhythm.  Pulmonary:     Effort: Pulmonary effort is normal.     Breath sounds: Normal breath sounds.  Musculoskeletal:        General: Normal range of motion.     Cervical back: Normal range of motion.  Lymphadenopathy:     Cervical: No cervical adenopathy.  Skin:    General: Skin is warm and dry.  Neurological:     Mental Status: She is alert.  Psychiatric:        Mood and Affect: Mood normal.        Behavior: Behavior normal.      Assessment and Plan   Health Maintenance counseling: 1. Anticipatory guidance: Patient counseled regarding regular dental exams q6 months, eye exams,  avoiding smoking and second hand smoke,  limiting alcohol to 1 beverage per day, no illicit drugs.   2. Risk factor reduction:  Advised patient of need for regular exercise and diet rich with fruits and vegetables to reduce risk of heart attack and stroke. Exercise- most days.  Wt Readings from Last 3 Encounters:  03/29/23 156 lb 9.6 oz (71 kg)  10/06/21 163 lb 9.6 oz (74.2 kg)  09/04/21 160 lb 9.6 oz (72.8 kg)   3. Immunizations/screenings/ancillary studies Immunization History  Administered Date(s) Administered   Influenza Split 06/17/2012   Influenza-Unspecified 05/10/2021   PFIZER(Purple Top)SARS-COV-2 Vaccination 04/10/2020, 05/18/2020   Health Maintenance Due  Topic Date Due   Hepatitis C Screening  Never done   DTaP/Tdap/Td (1 - Tdap) Never done    4. Cervical cancer screening- due 2026 5. Breast cancer screening-  mammogram done 08/2022 6. Colon cancer screening -  due 2032 7. Skin cancer screening-  advised regular sunscreen use. Denies worrisome, changing, or new skin lesions.  8. Birth control/STD check- N/A  9. Osteoporosis screening- N/A 10. Alcohol screening: about 3 glasses/week 11. Smoking associated screening (lung cancer screening, AAA screen 65-75, UA)- non- smoker   Annual physical exam -     CBC with Differential/Platelet -     Comprehensive metabolic panel -     TSH -     Lipid panel  Menstrual migraine without status migrainosus, not intractable Assessment & Plan: Chronic - stable taking Imitrex 100mg  prn & Ibuprofen, tolerating, denies SE sending refill f/u 1 yr  Orders: -     SUMAtriptan Succinate; Take 1 tablet (100 mg total) by mouth daily as needed.  Dispense: 9 tablet; Refill: 3  Elevated blood pressure reading- denies any sx, believe situational r/t caffeine intake, rushing this am, having menstrual migraine. Pt has lost weight, exercising more, advised on water intake, low sodium diet, continuing exercise. Pt will monitor at home & let me know if any continued elevated readings.   Need for hepatitis C screening test -     Hepatitis C antibody   Recommended follow up:  Return in about 1 year (around 03/28/2024) for Complete physical w/fasting labs. Future Appointments  Date Time Provider Department Center  04/03/2024  9:20 AM Dulce Sellar, NP [facility]    Lab/Order associations: fasting   Dulce Sellar, NP

## 2023-03-30 LAB — HEPATITIS C ANTIBODY: Hepatitis C Ab: NONREACTIVE

## 2023-04-14 ENCOUNTER — Other Ambulatory Visit (HOSPITAL_COMMUNITY): Payer: Self-pay

## 2023-08-16 ENCOUNTER — Other Ambulatory Visit (HOSPITAL_COMMUNITY): Payer: Self-pay

## 2023-08-16 ENCOUNTER — Other Ambulatory Visit: Payer: Self-pay | Admitting: Family

## 2023-08-16 DIAGNOSIS — Z0000 Encounter for general adult medical examination without abnormal findings: Secondary | ICD-10-CM

## 2023-09-06 ENCOUNTER — Ambulatory Visit
Admission: RE | Admit: 2023-09-06 | Discharge: 2023-09-06 | Disposition: A | Discharge status: Home / Self Care | Payer: Commercial Managed Care - PPO | Source: Ambulatory Visit | Attending: Family | Admitting: Family

## 2023-09-06 DIAGNOSIS — Z0000 Encounter for general adult medical examination without abnormal findings: Secondary | ICD-10-CM

## 2023-09-06 DIAGNOSIS — Z1231 Encounter for screening mammogram for malignant neoplasm of breast: Secondary | ICD-10-CM | POA: Diagnosis not present

## 2024-01-21 ENCOUNTER — Other Ambulatory Visit (HOSPITAL_COMMUNITY): Payer: Self-pay

## 2024-04-03 ENCOUNTER — Encounter: Payer: Self-pay | Admitting: Family

## 2024-04-03 ENCOUNTER — Ambulatory Visit (INDEPENDENT_AMBULATORY_CARE_PROVIDER_SITE_OTHER): Payer: Commercial Managed Care - PPO | Admitting: Family

## 2024-04-03 VITALS — BP 140/90 | HR 89 | Temp 98.4°F | Ht 65.0 in | Wt 165.4 lb

## 2024-04-03 DIAGNOSIS — Z0000 Encounter for general adult medical examination without abnormal findings: Secondary | ICD-10-CM

## 2024-04-03 DIAGNOSIS — Z8719 Personal history of other diseases of the digestive system: Secondary | ICD-10-CM | POA: Insufficient documentation

## 2024-04-03 DIAGNOSIS — R030 Elevated blood-pressure reading, without diagnosis of hypertension: Secondary | ICD-10-CM | POA: Insufficient documentation

## 2024-04-03 DIAGNOSIS — R5382 Chronic fatigue, unspecified: Secondary | ICD-10-CM | POA: Insufficient documentation

## 2024-04-03 DIAGNOSIS — G43009 Migraine without aura, not intractable, without status migrainosus: Secondary | ICD-10-CM | POA: Insufficient documentation

## 2024-04-03 NOTE — Progress Notes (Signed)
 Phone [phone]  Subjective:   Patient is a 48 y.o. female presenting for annual physical.    Chief Complaint  Patient presents with   Annual Exam    Non Fasting - will do fasting labs on separate day.  Discussed the use of AI scribe software for clinical note transcription with the patient, who gave verbal consent to proceed.  History of Present Illness   Bailey Norton is a 48 year old female who presents for a routine follow-up visit.  Migraine headaches - Migraines occur one to two times per month - Often associated with menstrual cycle, before or after - Currently experiencing a migraine - Uses sumatriptan  and ibuprofen  for symptom relief  Blood pressure and cardiovascular risk - Elevated blood pressure last year, normalized after monitoring - Mother has hypertension - Concern about weight contributing to blood pressure  Menstrual changes - Menstrual cycles are regular - Menstrual flow has become heavier over the past five years  Lifestyle modifications - Exercises three to five days per week, focusing on cardio and weights - Mindful of diet and water intake - Aims to reduce alcohol consumption, particularly wine     See problem oriented charting- ROS- full  review of systems was completed and negative except for HPI above.  The following were reviewed and entered/updated in epic: Past Medical History:  Diagnosis Date   Chronic fatigue 04/03/2024   Elevated blood-pressure reading without diagnosis of hypertension 04/03/2024   Headache(784.0)    hx migraines   Hemorrhoids    History of irritable bowel syndrome 04/03/2024   Migraine without aura and responsive to treatment 04/03/2024   Patient Active Problem List   Diagnosis Date Noted   Menstrual migraine without status migrainosus, not intractable 09/04/2021   Herpes simplex 09/04/2021   Past Surgical History:  Procedure Laterality Date   HERNIA REPAIR     age 76   left humerus surgery      Family  History  Problem Relation Age of Onset   Colon cancer Neg Hx    Esophageal cancer Neg Hx    Rectal cancer Neg Hx    Stomach cancer Neg Hx    BRCA 1/2 Neg Hx    Breast cancer Neg Hx     Medications- reviewed and updated Current Outpatient Medications  Medication Sig Dispense Refill   ibuprofen  (ADVIL ) 200 MG tablet 1 tablet as needed     Multiple Vitamin (MULTIVITAMINS PO) 1 tablet     SUMAtriptan  (IMITREX ) 100 MG tablet Take 1 tablet (100 mg total) by mouth daily as needed. 9 tablet 3   valACYclovir (VALTREX) 1000 MG tablet Take 1,000 mg by mouth 2 (two) times daily. Takes PRN     No current facility-administered medications for this visit.   Allergies-reviewed and updated No Known Allergies  Social History   Social History Narrative   Not on file    Objective:  BP (!) 140/90   Pulse 89   Temp 98.4 F (36.9 C) (Temporal)   Ht 5' 5 (1.651 m)   Wt 165 lb 6.4 oz (75 kg)   LMP 03/22/2024 (Approximate)   SpO2 98%   BMI 27.52 kg/m  Physical Exam Vitals and nursing note reviewed.  Constitutional:      Appearance: Normal appearance.  HENT:     Head: Normocephalic.     Right Ear: Tympanic membrane normal.     Left Ear: Tympanic membrane normal.     Nose: Nose normal.     Mouth/Throat:  Mouth: Mucous membranes are moist.  Eyes:     Pupils: Pupils are equal, round, and reactive to light.  Cardiovascular:     Rate and Rhythm: Normal rate and regular rhythm.  Pulmonary:     Effort: Pulmonary effort is normal.     Breath sounds: Normal breath sounds.  Musculoskeletal:        General: Normal range of motion.     Cervical back: Normal range of motion.  Lymphadenopathy:     Cervical: No cervical adenopathy.  Skin:    General: Skin is warm and dry.  Neurological:     Mental Status: She is alert.  Psychiatric:        Mood and Affect: Mood normal.        Behavior: Behavior normal.      Assessment and Plan   Health Maintenance counseling: 1. Anticipatory  guidance: Patient counseled regarding regular dental exams q6 months, eye exams,  avoiding smoking and second hand smoke, limiting alcohol to 1 beverage per day, no illicit drugs.   2. Risk factor reduction:  Advised patient of need for regular exercise and diet rich with fruits and vegetables to reduce risk of heart attack and stroke.  Wt Readings from Last 3 Encounters:  04/03/24 165 lb 6.4 oz (75 kg)  03/29/23 156 lb 9.6 oz (71 kg)  10/06/21 163 lb 9.6 oz (74.2 kg)   3. Immunizations/screenings/ancillary studies Immunization History  Administered Date(s) Administered   Influenza Split 06/17/2012   Influenza-Unspecified 05/10/2021   PFIZER(Purple Top)SARS-COV-2 Vaccination 04/10/2020, 05/18/2020   Tdap 02/07/2009   There are no preventive care reminders to display for this patient.   4. Cervical cancer screening- due 2028 5. Breast cancer screening-  mammogram done 08/2022 6. Colon cancer screening -  due 2032 7. Skin cancer screening- advised regular sunscreen use. Denies worrisome, changing, or new skin lesions.  8. Birth control/STD check- N/A  9. Osteoporosis screening- N/A 10. Alcohol screening: about 3 glasses/week 11. Smoking associated screening (lung cancer screening, AAA screen 65-75, UA)- non- smoker  12. Exercise- most days.   Assessment and Plan    Annual Physical Pap smear is due in 2028. Mammogram and colonoscopy are current. Regular exercise and dental visits maintained. Menstrual cycles regular with previously noted heavier flow. - Continue regular mammograms as per guidelines. - Maintain regular exercise routine. - Continue healthy diet: lean protein, many vegetables, whole fruit 2-3 servings per day, avoid processed foods and white carbs.  Migraine Migraines occur 1-2 times monthly, likely menstrually related. Sumatriptan  effective.  - Continue sumatriptan  as needed for migraines.  Elevated blood pressure Blood pressure slightly elevated, possibly due to  migraine. Previous readings slightly elevated also last year and with a migraine then also. Family history of hypertension. Weight gain noted. Discussed monitoring and lifestyle modifications. - Recheck blood pressure during the visit. - Monitor blood pressure at home over the next few weeks. - Consider lifestyle modifications, including reducing alcohol intake and monitoring salt intake, increase water and caffeine free beverages to 2.5 liters daily.      Recommended follow up:  Return for any future concerns; Complete physical w/fasting labs, fasting labs. Future Appointments  Date Time Provider Department Center  04/09/2025  9:30 AM Lucius Krabbe, NP [facility]    Lab/Order associations: fasting, will return for labs   Lucius Krabbe, NP

## 2024-04-03 NOTE — Patient Instructions (Addendum)
 It was very nice to see you today!    I will review your lab results via MyChart in a few days.  You look great! Stay well! Keep exercising!  Let me know if your blood pressure is remaining elevated > 130/90.       PLEASE NOTE:  If you had any lab tests please let us  know if you have not heard back within a few days. You may see your results on MyChart before we have a chance to review them but we will give you a call once they are reviewed by us . If we ordered any referrals today, please let us  know if you have not heard from their office within the next week.

## 2024-05-10 ENCOUNTER — Other Ambulatory Visit: Payer: Self-pay | Admitting: Family

## 2024-05-10 DIAGNOSIS — G43829 Menstrual migraine, not intractable, without status migrainosus: Secondary | ICD-10-CM

## 2024-05-11 ENCOUNTER — Other Ambulatory Visit (HOSPITAL_COMMUNITY): Payer: Self-pay

## 2024-05-11 ENCOUNTER — Other Ambulatory Visit: Payer: Self-pay

## 2024-05-11 MED ORDER — SUMATRIPTAN SUCCINATE 100 MG PO TABS
100.0000 mg | ORAL_TABLET | Freq: Every day | ORAL | 3 refills | Status: AC | PRN
  Filled 2024-05-11: qty 9, 15d supply, fill #0

## 2025-04-09 ENCOUNTER — Encounter: Admitting: Family
